# Patient Record
Sex: Female | Born: 1995 | Hispanic: Refuse to answer | Smoking: Never smoker
Health system: Southern US, Community
[De-identification: ages and names within clinical notes are randomized; demographics above are authoritative.]

## PROBLEM LIST (undated history)

## (undated) DIAGNOSIS — T7840XA Allergy, unspecified, initial encounter: Secondary | ICD-10-CM

## (undated) HISTORY — DX: Allergy, unspecified, initial encounter: T78.40XA

## (undated) HISTORY — PX: TONSILLECTOMY: SUR1361

## (undated) HISTORY — PX: TONSILLECTOMY AND ADENOIDECTOMY: SUR1326

## (undated) HISTORY — PX: WISDOM TOOTH EXTRACTION: SHX21

---
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
## 2017-08-18 ENCOUNTER — Ambulatory Visit (INDEPENDENT_AMBULATORY_CARE_PROVIDER_SITE_OTHER): Payer: BLUE CROSS/BLUE SHIELD | Admitting: Family Medicine

## 2017-08-18 ENCOUNTER — Encounter: Payer: Self-pay | Admitting: Family Medicine

## 2017-08-18 VITALS — BP 112/68 | HR 63 | Temp 97.8°F | Resp 14

## 2017-08-18 DIAGNOSIS — J069 Acute upper respiratory infection, unspecified: Secondary | ICD-10-CM

## 2017-08-18 MED ORDER — AZITHROMYCIN 250 MG PO TABS: ORAL_TABLET | ORAL | 0 refills | Status: DC

## 2017-08-18 NOTE — Progress Notes (Signed)
Patient presents today with symptoms of nasal congestion, mild productive cough off and on for the last 2-3 weeks. Patient denies any fever or chills. She denies any chest pain or shortness of breath. She has heard herself wheezing at times. She has been told in the past that she has restrictive airway disease/asthma. She does have an inhaler but rarely needs to use it. She denies any seasonal allergies. She has not been taking any cough medicine on a regular basis while she has had symptoms.  ROS: Negative except mentioned above. Vitals as per Epic. GENERAL: NAD HEENT: mild pharyngeal erythema, no exudate, no erythema of TMs, no cervical LAD RESP: CTA B, no accessory muscle use, no wheezing appreciated CARD: RRR NEURO: CN II-XII grossly intact   A/P: URI - will treat with Claritin, Delsym, Z-Pak, rest, hydration, seek medical attention if symptoms persist or worsen as discussed should not do any athletic activity if febrile. Should use albuterol inhaler when necessary as discussed.

## 2017-08-26 ENCOUNTER — Ambulatory Visit (INDEPENDENT_AMBULATORY_CARE_PROVIDER_SITE_OTHER): Payer: BLUE CROSS/BLUE SHIELD | Admitting: Family Medicine

## 2017-08-26 ENCOUNTER — Ambulatory Visit
Admission: RE | Admit: 2017-08-26 | Discharge: 2017-08-26 | Disposition: A | Discharge status: Home / Self Care | Payer: BLUE CROSS/BLUE SHIELD | Source: Ambulatory Visit | Attending: Family Medicine | Admitting: Family Medicine

## 2017-08-26 ENCOUNTER — Other Ambulatory Visit: Payer: Self-pay | Admitting: Family Medicine

## 2017-08-26 ENCOUNTER — Encounter: Payer: Self-pay | Admitting: Family Medicine

## 2017-08-26 VITALS — BP 109/70 | HR 69 | Temp 97.8°F | Resp 14

## 2017-08-26 DIAGNOSIS — J209 Acute bronchitis, unspecified: Secondary | ICD-10-CM | POA: Diagnosis not present

## 2017-08-26 DIAGNOSIS — R05 Cough: Secondary | ICD-10-CM | POA: Diagnosis present

## 2017-08-26 DIAGNOSIS — R059 Cough, unspecified: Secondary | ICD-10-CM

## 2017-08-26 NOTE — Progress Notes (Signed)
Patient presents today for follow-up regarding her upper respiratory infection. Patient states that she finished her Z-Pak. She has been starting to use her inhaler more frequently every 4 hours or so. She states that she can't get a deep breath in her chest. She has been prescribed a maintenance medication but does not remember the name of it. She has not been using that medication. She denies any fevers or chills or any chest pain. She has been taking over-the-counter cough medicine.  ROS: Negative except mentioned above. Vitals as per Epic. GENERAL: NAD HEENT: no pharyngeal erythema, no exudate, no erythema of TMs, no cervical LAD RESP: Slightly decreased breath sounds at the bases, no wheezing appreciated no accessory muscle use noted, can talk in complete sentences without stopping CARD: RRR NEURO: CN II-XII grossly intact   A/P: Bronchitis - will get CXR, continue over-the-counter cough medicine and oral antihistamine, continue use albuterol inhaler, I have asked that the patient inform me when she gets home about the questionable steroid inhaler she has at home. Will start using this type of inhaler as a maintenance for now. Will seek medical attention if symptoms persist or worsen. No physical activity until improvement in respiratory symptoms.

## 2017-11-13 ENCOUNTER — Ambulatory Visit (INDEPENDENT_AMBULATORY_CARE_PROVIDER_SITE_OTHER): Payer: BLUE CROSS/BLUE SHIELD | Admitting: Family Medicine

## 2017-11-13 VITALS — BP 117/73 | HR 82 | Temp 99.3°F | Resp 14

## 2017-11-13 DIAGNOSIS — J069 Acute upper respiratory infection, unspecified: Secondary | ICD-10-CM

## 2017-11-13 MED ORDER — CEFDINIR 300 MG PO CAPS: 300.0000 mg | ORAL_CAPSULE | Freq: Two times a day (BID) | ORAL | 0 refills | Status: DC

## 2017-11-13 NOTE — Progress Notes (Signed)
Patient presents today with symptoms of nasal congestion, cough. Patient has had symptoms for the last week. She states that her mucous is yellow in color. She does have a history of asthma and does take her albuterol inhaler periodically. She states that over Thanksgiving break she got Omnicef for a upper respiratory infection and it cleared it up. She requests this medication again. She denies any new allergens. She denies any fever, chest pain, shortness of breath, severe headache.  ROS: Negative except mentioned above. Vitals as per Epic. GENERAL: NAD HEENT: no pharyngeal erythema, no exudate, no erythema of TMs, no cervical LAD RESP: CTA B CARD: RRR NEURO: CN II-XII grossly intact   A/P: Upper respiratory infection - will treat with Omnicef, Delsym when necessary, Claritin when necessary, look for any possible allergens that could be continued beatings his symptoms, rest, hydration, no athletic activity if febrile, seek medical attention if symptoms persist or worsen as discussed.

## 2018-02-03 ENCOUNTER — Encounter: Payer: Self-pay | Admitting: Family Medicine

## 2018-02-03 ENCOUNTER — Ambulatory Visit (INDEPENDENT_AMBULATORY_CARE_PROVIDER_SITE_OTHER): Payer: PRIVATE HEALTH INSURANCE | Admitting: Family Medicine

## 2018-02-03 DIAGNOSIS — S86899A Other injury of other muscle(s) and tendon(s) at lower leg level, unspecified leg, initial encounter: Secondary | ICD-10-CM | POA: Diagnosis not present

## 2018-02-03 MED ORDER — DICLOFENAC SODIUM 75 MG PO TBEC: 75.0000 mg | DELAYED_RELEASE_TABLET | Freq: Two times a day (BID) | ORAL | 0 refills | Status: DC

## 2018-02-03 NOTE — Progress Notes (Addendum)
Patient presents today with history of bilateral shin splints. She is a jumping events in track and field. Patient states that she has had problems with shin splints for a few years. She states recently they have caused increased pain. She denies any focal area of discomfort. There is no consistency of which side hurts more. She does feel knots along the medial border of the tibia at times. She denies any tingling or numbness of the foot or discoloration of the foot. She denies the lower extremity feeling "rock hard" with doing activity. Patient does have flat feet and was told in the past to get inserts but has not done so. She has not taken any medications for her symptoms. She has not seen PT yet but has been working with her trainer. She did take 1 week of rest last week which did not help much. She is unsure of her vitamin D level. She denies any history of stress injury/stress fracture in the past. She denies any pain at rest or limp with walking.  ROS: Negative except mentioned above.  GENERAL: NAD MSK: Bilateral lower extremities - pes planus, overpronation, mild to moderate tenderness along medial border of the tibia bilaterally, there is no focal area of tenderness bilaterally, full range of motion of lower extremities, mildly tight calf muscles, negative hop test, NV intact NEURO: CN II-XII grossly intact   A/P: Medial Tibial Stress Syndrome Bilaterally - encourage patient to ice, stretch, wear inserts in all shoes, needs arch support, modify activity during practice, would recommend that patient CPT for evaluation/treatment, can try dry needling if patient wants, Voltaren as needed, no suggestive symptoms of compartment syndrome at this time but did discuss these with patient, will discuss above with trainer, seek medical attention if symptoms persist or worsen as discussed.

## 2018-02-04 LAB — VITAMIN D 25 HYDROXY (VIT D DEFICIENCY, FRACTURES): VIT D 25 HYDROXY: 27.6 ng/mL — AB (ref 30.0–100.0)

## 2018-06-19 ENCOUNTER — Ambulatory Visit (INDEPENDENT_AMBULATORY_CARE_PROVIDER_SITE_OTHER): Payer: BLUE CROSS/BLUE SHIELD | Admitting: Family Medicine

## 2018-06-19 DIAGNOSIS — F419 Anxiety disorder, unspecified: Secondary | ICD-10-CM
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
## 2018-07-23 ENCOUNTER — Ambulatory Visit (INDEPENDENT_AMBULATORY_CARE_PROVIDER_SITE_OTHER): Payer: PRIVATE HEALTH INSURANCE | Admitting: Family Medicine

## 2018-07-23 ENCOUNTER — Encounter: Payer: Self-pay | Admitting: Family Medicine

## 2018-07-23 VITALS — HR 68 | Temp 98.8°F | Resp 14

## 2018-07-23 DIAGNOSIS — J45990 Exercise induced bronchospasm: Secondary | ICD-10-CM | POA: Diagnosis not present

## 2018-07-23 MED ORDER — ALBUTEROL SULFATE HFA 108 (90 BASE) MCG/ACT IN AERS
2.0000 | INHALATION_SPRAY | Freq: Four times a day (QID) | RESPIRATORY_TRACT | 2 refills | Status: DC | PRN
Start: 2018-07-23 — End: 2020-11-07

## 2018-07-23 NOTE — Progress Notes (Signed)
Patient presents today for refill of her Proventil.  Patient has exercise-induced bronchospasm.  She denies any significant problems today.  As the air does become more dry she does notice that she needs an inhaler more with exercise.  She denies waking up in the middle of the night to use her inhaler.  She denies any hospitalizations related to her asthma in the past.  She denies any chest pain, fever, shortness of breath at this time.  ROS: Negative except mentioned above. Vitals as per Epic GENERAL: NAD HEENT: no pharyngeal erythema, no exudate, no erythema of TMs, no cervical LAD RESP: CTA B CARD: RRR NEURO: CN II-XII grossly intact    A/P: Exercise-induced bronchospasm -will refill patient's Proventil, denies patient on when to use medication, encourage patient to start oral antihistamine if any allergies during the fall season, should get baseline peak flows by athletic trainer, recommend patient always carry Proventil with her, seek medical attention needed.

## 2018-08-13 ENCOUNTER — Encounter: Payer: Self-pay | Admitting: Family Medicine

## 2018-08-13 ENCOUNTER — Ambulatory Visit
Admission: RE | Admit: 2018-08-13 | Discharge: 2018-08-13 | Disposition: A | Discharge status: Home / Self Care | Payer: BLUE CROSS/BLUE SHIELD | Source: Ambulatory Visit | Attending: Family Medicine | Admitting: Family Medicine

## 2018-08-13 ENCOUNTER — Ambulatory Visit (INDEPENDENT_AMBULATORY_CARE_PROVIDER_SITE_OTHER): Payer: Self-pay | Admitting: Family Medicine

## 2018-08-13 VITALS — Resp 14

## 2018-08-13 DIAGNOSIS — M79671 Pain in right foot: Secondary | ICD-10-CM

## 2018-08-14 ENCOUNTER — Other Ambulatory Visit: Payer: Self-pay | Admitting: Family Medicine

## 2018-08-14 DIAGNOSIS — M79671 Pain in right foot: Secondary | ICD-10-CM

## 2018-08-14 NOTE — Progress Notes (Signed)
Patient presents today with history of right foot pain for the last 3 weeks.  Patient is a Electronics engineer and track and field.  She denies any injury that she can recall.  She denies any significant swelling or bruising of the area.  She does admit to pes planus and training barefoot at times on the grass.  She denies any stress fractures in the past.  She has had shinsplints.  Patient does not like to drink milk however does eat other dairy products.  Her vitamin D3 was 27.6 (01/2018).  She currently is not taking any vitamin D supplement.  She denies any changes in shoewear.  She denies any problems with menstrual cycles.  ROS: Negative except mentioned above. Vitals as per Epic. GENERAL: NAD RESP: CTA B CARD: RRR MSK: Right foot -pes planus, there is no significant swelling or ecchymosis appreciated, there is a focal area of tenderness in the midfoot at the proximal second and third metatarsal area, full range of motion, positive hop test, N/V intact NEURO: CN II-XII grossly intact   A/P: Right foot pain -concerns for stress injury, will do x-ray and proceed with getting MRI, should wear walking boot for now, can do crosstraining and upper body activity if tolerated with no pain, encourage patient to take vitamin D3 4000 IUs daily.  Will discuss plan with trainer.  Seek medical attention if symptoms persist or worsen as discussed.

## 2018-08-19 ENCOUNTER — Ambulatory Visit
Admission: RE | Admit: 2018-08-19 | Discharge: 2018-08-19 | Disposition: A | Discharge status: Home / Self Care | Payer: BLUE CROSS/BLUE SHIELD | Source: Ambulatory Visit | Attending: Family Medicine | Admitting: Family Medicine

## 2018-08-19 DIAGNOSIS — M84374A Stress fracture, right foot, initial encounter for fracture: Secondary | ICD-10-CM | POA: Insufficient documentation

## 2018-08-19 DIAGNOSIS — M79671 Pain in right foot: Secondary | ICD-10-CM

## 2018-08-19 DIAGNOSIS — X58XXXA Exposure to other specified factors, initial encounter: Secondary | ICD-10-CM | POA: Diagnosis not present

## 2018-11-30 ENCOUNTER — Ambulatory Visit (INDEPENDENT_AMBULATORY_CARE_PROVIDER_SITE_OTHER): Payer: PRIVATE HEALTH INSURANCE | Admitting: Family Medicine

## 2018-11-30 VITALS — Temp 98.2°F | Resp 14

## 2018-11-30 DIAGNOSIS — M898X6 Other specified disorders of bone, lower leg: Secondary | ICD-10-CM | POA: Diagnosis not present

## 2018-12-01 ENCOUNTER — Ambulatory Visit
Admission: RE | Admit: 2018-12-01 | Discharge: 2018-12-01 | Disposition: A | Discharge status: Home / Self Care | Payer: BLUE CROSS/BLUE SHIELD | Attending: Family Medicine | Admitting: Family Medicine

## 2018-12-01 ENCOUNTER — Ambulatory Visit
Admission: RE | Admit: 2018-12-01 | Discharge: 2018-12-01 | Disposition: A | Discharge status: Home / Self Care | Payer: BLUE CROSS/BLUE SHIELD | Source: Ambulatory Visit | Attending: Family Medicine | Admitting: Family Medicine

## 2018-12-01 DIAGNOSIS — M79669 Pain in unspecified lower leg: Secondary | ICD-10-CM | POA: Diagnosis present

## 2018-12-01 DIAGNOSIS — M898X6 Other specified disorders of bone, lower leg: Secondary | ICD-10-CM | POA: Diagnosis present

## 2018-12-02 ENCOUNTER — Other Ambulatory Visit: Payer: Self-pay | Admitting: Family Medicine

## 2018-12-02 DIAGNOSIS — M79662 Pain in left lower leg: Secondary | ICD-10-CM

## 2018-12-03 ENCOUNTER — Ambulatory Visit: Admission: RE | Admit: 2018-12-03 | Payer: PRIVATE HEALTH INSURANCE | Source: Ambulatory Visit

## 2018-12-09 ENCOUNTER — Ambulatory Visit
Admission: RE | Admit: 2018-12-09 | Discharge: 2018-12-09 | Disposition: A | Discharge status: Home / Self Care | Payer: BLUE CROSS/BLUE SHIELD | Source: Ambulatory Visit | Attending: Family Medicine | Admitting: Family Medicine

## 2018-12-09 DIAGNOSIS — M79662 Pain in left lower leg: Secondary | ICD-10-CM | POA: Diagnosis present

## 2019-04-06 NOTE — Progress Notes (Signed)
Patient presents with symptoms of right mid shin pain for the past 4 weeks. Feels different now then her normal shin splints. 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 She denies any trauma or injury to the site. She denies any ecchymosis but has some "knots" along the shin. Recently has noticed some pain with walking. She was put in a walking boot by her trainer. She denies any pain in the boot today. She denies any pain while sleeping. She denies any menstrual irregularities. Admits to normal amount of training, no extra running. Patient has been told in the past that she has low Vitamin D (27.6, 02/03/2018) but admits she has not been consistent with taking her Vitamin D3 supplement.   ROS: Negative except mentioned above. Vitals as per Epic.  GENERAL: NAD RESP: CTA B CARD: RRR MSK: RLE - no obvious deformity, mild tenderness along mid tibia  (2.5 cm area), FROM, +hop test, nv intact   NEURO: CN II-XII grossly intact   A/P: RLE Pain - suggestive of stress injury, will get Xrays and MRI, stay in walking boot for now, no lower extremity activity for now until imaging reviewed, encouraged being consistent with 4000 IU Vitamin D3 supplement, repeat level in 3 months, discussed plan with athletic trainer.

## 2019-10-08 IMAGING — MR MR [PERSON_NAME] LOW W/O CM*L*
4 of 5 series · 11 of 40 positions shown · non-contrast
Comparison: None.

CLINICAL DATA: Anterior left shin pain x4 weeks.

EXAM:
MRI OF LOWER LEFT EXTREMITY WITHOUT CONTRAST
TECHNIQUE: Multiplanar, multisequence MR imaging of the left leg was performed.
No intravenous contrast was administered.

[Series 4: t1_tse_cor_480_bilat_ · coronal · left · 4.0mm · 0.41mm/px · 3 of 26 slices shown]
[im 1/26]
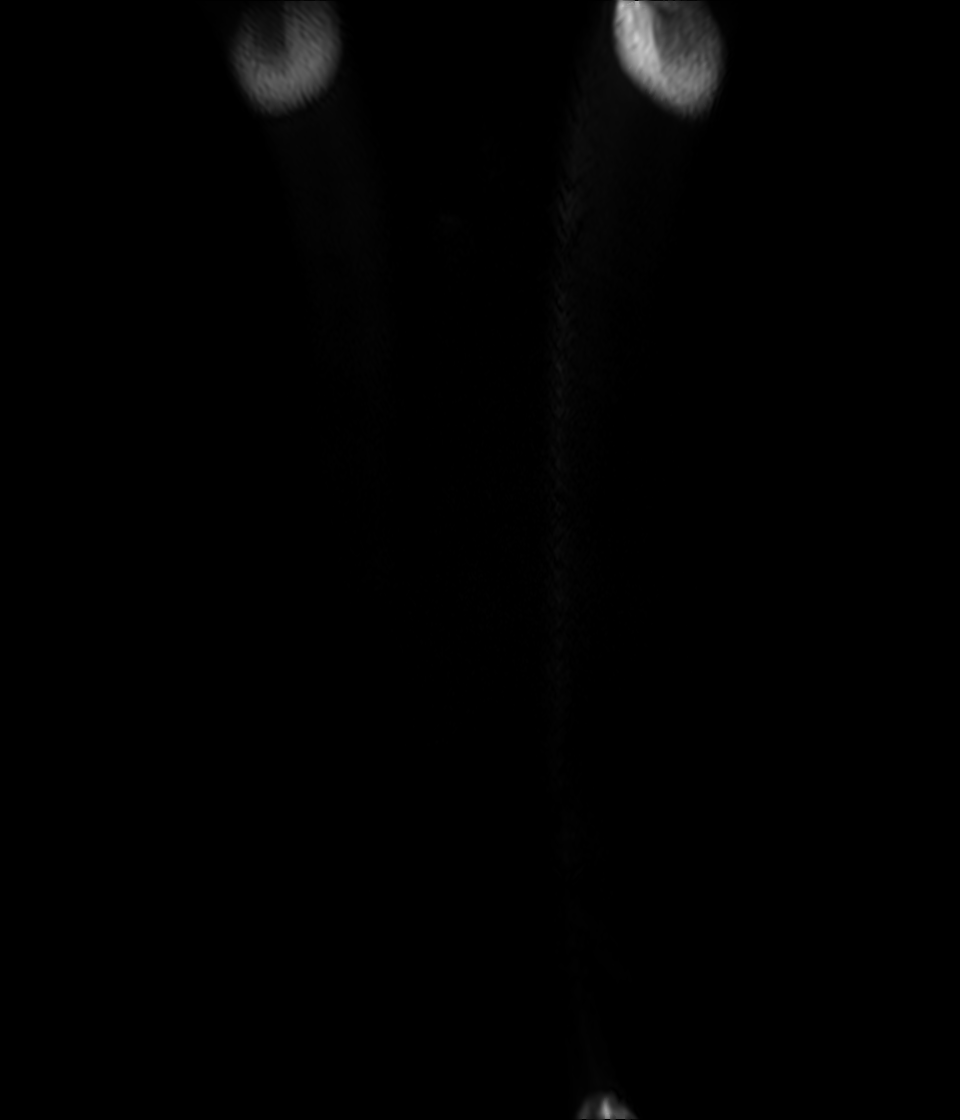
[im 17/26]
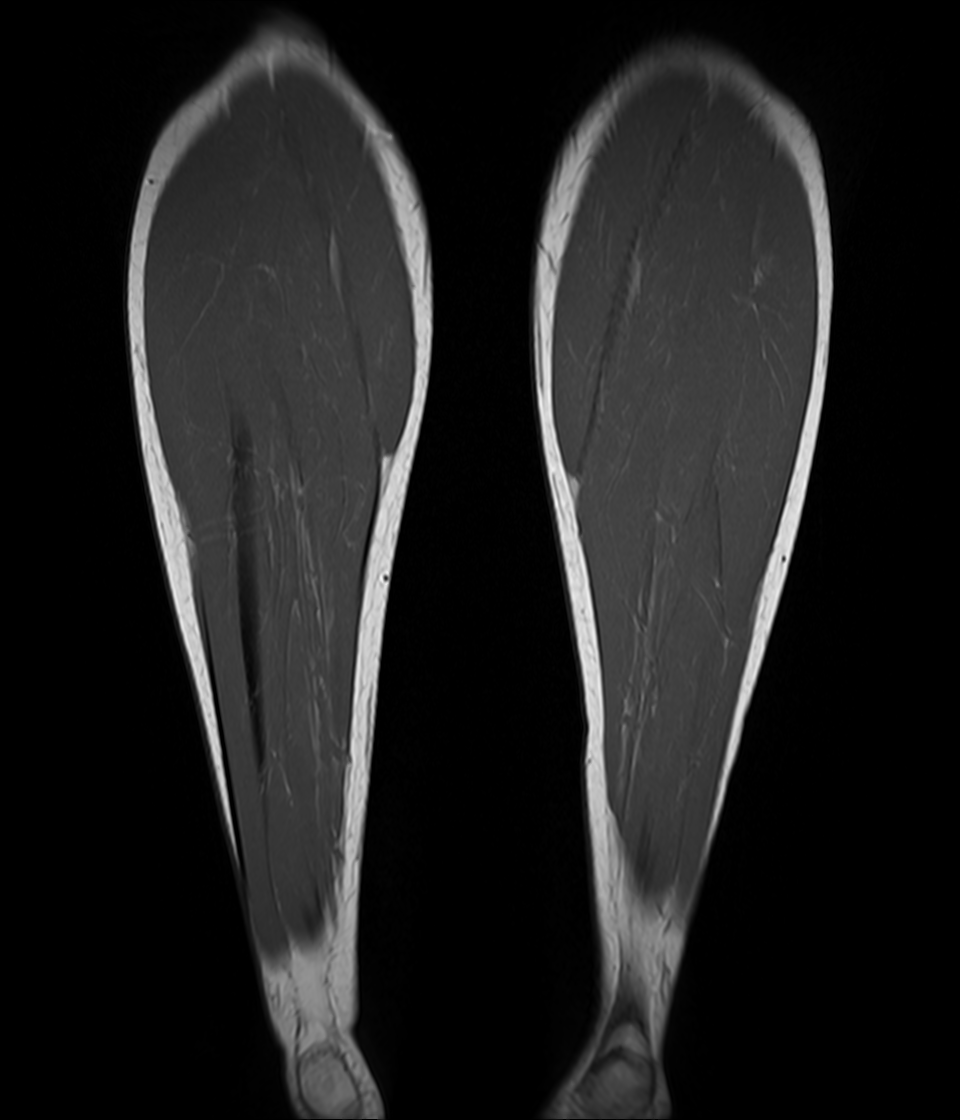
[im 26/26]
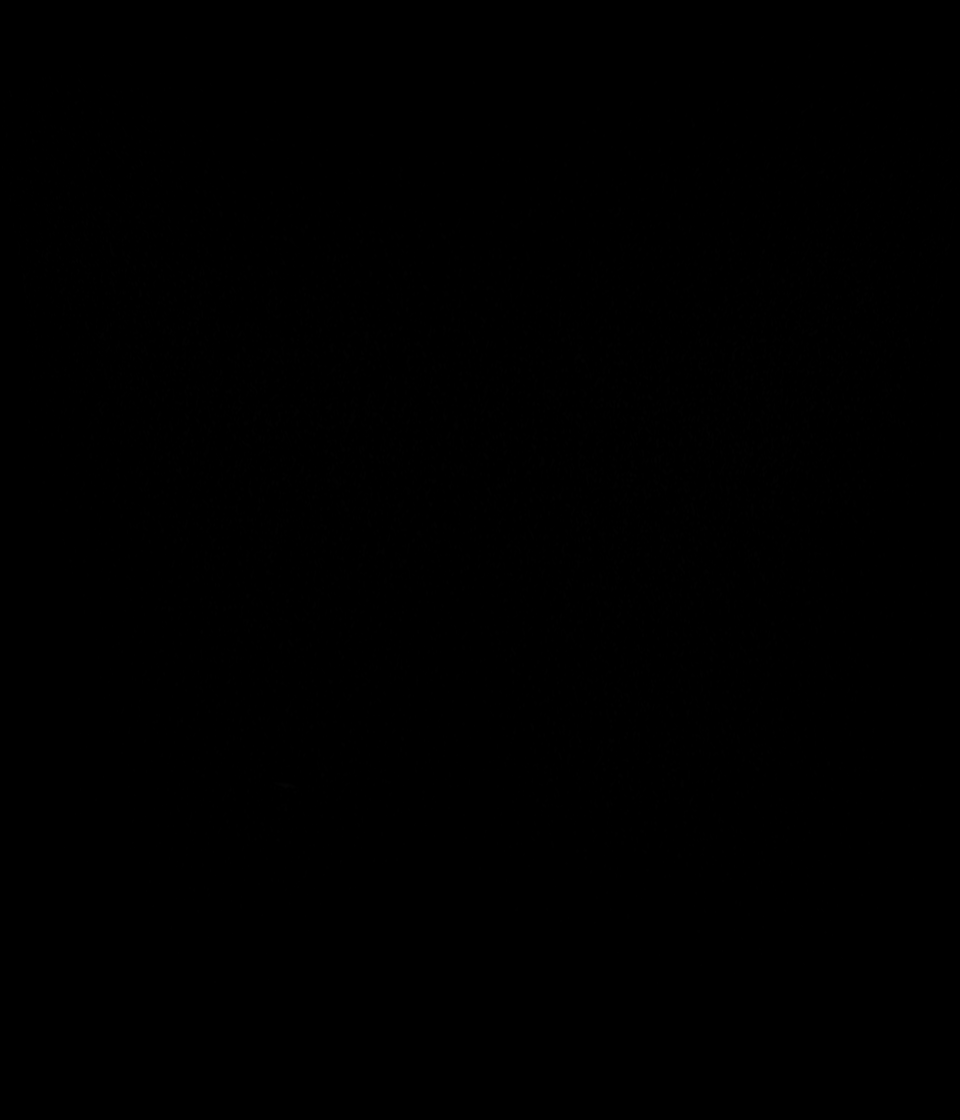

[Series 8: ax t1_comp_filt · axial · left · 4.0mm · 0.70mm/px · z∈[-165,+135]mm · 3 of 88 slices shown]
[im 14/88]
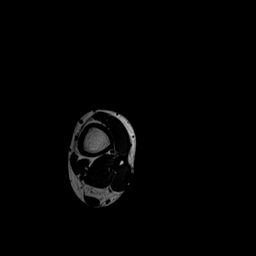
[im 47/88]
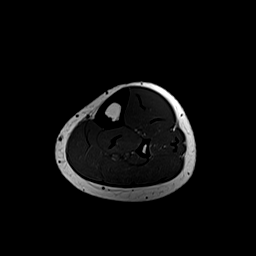
[im 74/88]
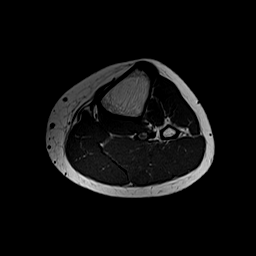

[Series 11: T2 · axial · left · 4.0mm · 0.66mm/px · z∈[-165,+135]mm · 3 of 88 slices shown]
[im 14/88]
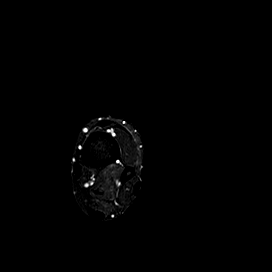
[im 47/88]
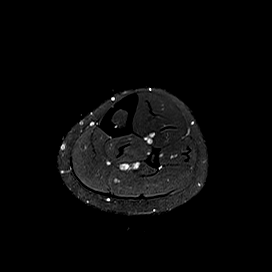
[im 74/88]
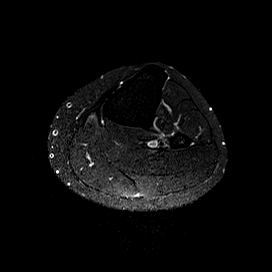

[Series 12: t2_tse_stir_sag · sagittal · left · 4.5mm · 0.42mm/px · 2 of 25 slices shown]
[im 1/25]
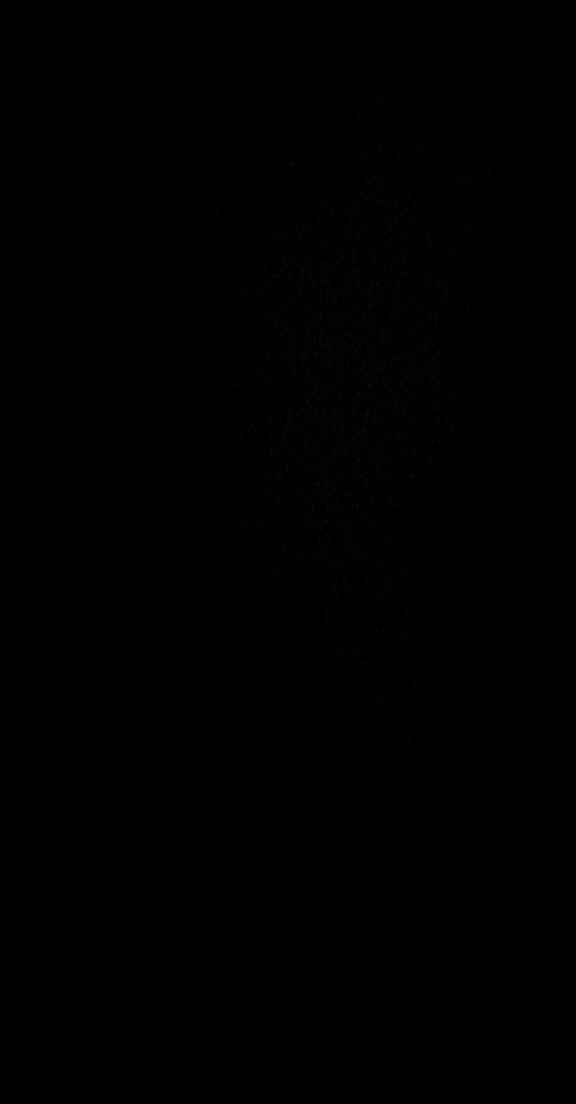
[im 17/25]
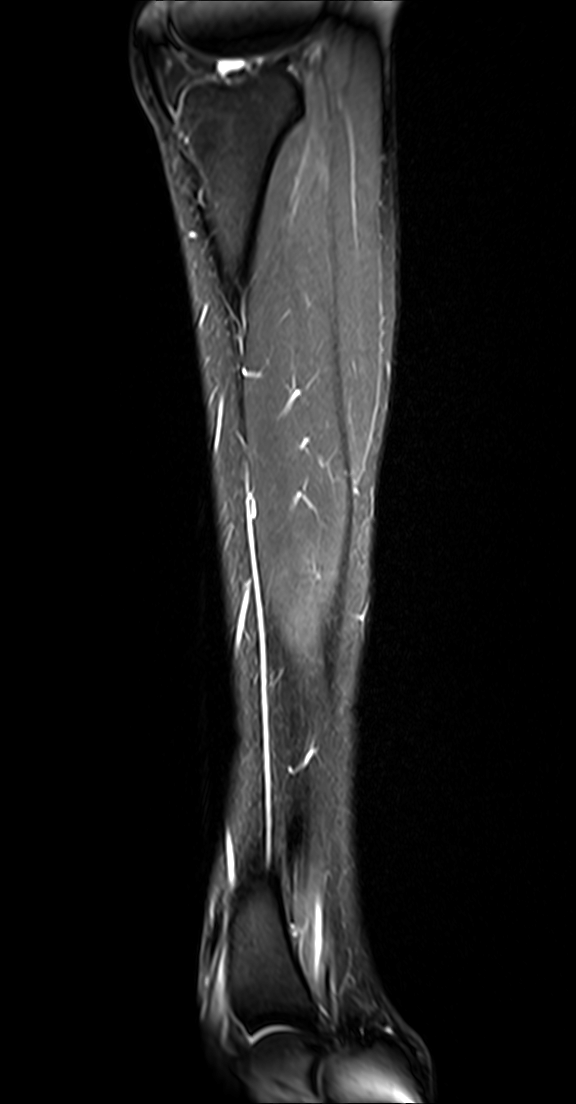

[11 of 40 positions shown; findings below may reference images not displayed]

FINDINGS: Bones/Joint/Cartilage

Cortical thickening of the anterior left tibial cortex at midshaft
is identified without fracture line. The fibula is intact. Healing
or healed stress fractures may manifest as such eccentric cortical
thickening. Minimal marrow edema deep to the area of focal
thickening is noted likely representing some residual stigmata of a
stress fracture, image 38, series 11 for example.

Ligaments

Noncontributory

Muscles and Tendons

No intramuscular edema, fluid or atrophy.

Soft tissues

Negative
IMPRESSION: 1. Eccentric cortical thickening of the mid tibial diaphysis
anteriorly which may reflect a healing or healed stress fracture
with some residual underlying marrow edema noted at midshaft.
2. No acute fracture line is noted.

## 2020-06-27 ENCOUNTER — Encounter: Payer: Self-pay | Admitting: Medical

## 2020-06-27 ENCOUNTER — Ambulatory Visit: Payer: BC Managed Care – PPO | Admitting: Medical

## 2020-06-27 ENCOUNTER — Other Ambulatory Visit: Payer: Self-pay

## 2020-06-27 VITALS — BP 136/80 | HR 90 | Temp 97.7°F | Resp 16 | Wt 135.8 lb

## 2020-06-27 DIAGNOSIS — H6983 Other specified disorders of Eustachian tube, bilateral: Secondary | ICD-10-CM

## 2020-06-27 DIAGNOSIS — R591 Generalized enlarged lymph nodes: Secondary | ICD-10-CM

## 2020-06-27 MED ORDER — PREDNISONE 10 MG (21) PO TBPK: ORAL_TABLET | ORAL | 0 refills | Status: DC

## 2020-06-27 NOTE — Progress Notes (Signed)
Subjective:    Patient ID: Margaret Hooper, female    DOB: 01-04-1996, 24 y.o.   MRN: [id number]  HPI 24 yo female in non acute distress. Presents today with swelling to neck on left  side. Denies any infections or rashes on body.  Took Benadryl on Sunday night for nasal congestion. She just returned to  [facility] ( she was living there and now has returned to [place].  Blood pressure 136/80, pulse 90, temperature 97.7 F (36.5 C), temperature source Temporal, resp. rate 16, weight 135 lb 12.8 oz (61.6 kg), SpO2 100 %.  Allergies  Allergen Reactions  . Penicillins Shortness Of Breath  . Sulfur Rash    Review of Systems  Constitutional: Positive for fatigue. Negative for chills and fever.  HENT: Positive for congestion (Sunday night now resolved.), rhinorrhea (Sunday night) and sneezing. Negative for ear discharge, postnasal drip, sinus pressure, sinus pain and sore throat.   Respiratory: Positive for chest tightness (mild noted when trying to take a deep breath has Asthma history, does not use inhaler daily). Negative for cough and shortness of breath.   Cardiovascular: Negative for chest pain.  Gastrointestinal: Negative for abdominal pain, diarrhea, nausea and vomiting.  Musculoskeletal: Negative for back pain.  Allergic/Immunologic: Positive for environmental allergies.  Hematological: Positive for adenopathy.      hx adnoids/tonils surgery Objective:   Physical Exam Vitals and nursing note reviewed.  HENT:     Right Ear: Ear canal and external ear normal. A middle ear effusion is present.     Left Ear: Ear canal and external ear normal. A middle ear effusion (worse>then right) is present.     Nose: Congestion (right) and rhinorrhea (right) present.     Mouth/Throat:     Mouth: Mucous membranes are moist.     Pharynx: Oropharynx is clear.  Eyes:     Extraocular Movements: Extraocular movements intact.     Conjunctiva/sclera: Conjunctivae normal.     Pupils: Pupils are  equal, round, and reactive to light.  Neck:   Cardiovascular:     Rate and Rhythm: Normal rate and regular rhythm.  Pulmonary:     Effort: Pulmonary effort is normal.     Breath sounds: Normal breath sounds.  Musculoskeletal:        General: Normal range of motion.  Lymphadenopathy:     Head:     Right side of head: No submental, submandibular, tonsillar, preauricular, posterior auricular or occipital adenopathy.     Left side of head: No submental, submandibular, tonsillar, preauricular, posterior auricular or occipital adenopathy.     Cervical: Cervical adenopathy present.     Right cervical: No posterior cervical adenopathy.    Left cervical: Superficial cervical adenopathy present. No deep or posterior cervical adenopathy.     Upper Body:     Right upper body: No supraclavicular or axillary adenopathy.     Left upper body: No supraclavicular or axillary adenopathy.  Skin:    General: Skin is warm and dry.  Neurological:     General: No focal deficit present.     Mental Status: She is oriented to person, place, and time.     GCS: GCS eye subscore is 4. GCS verbal subscore is 5. GCS motor subscore is 6.     Cranial Nerves: Cranial nerves are intact.     Motor: Motor function is intact.  Psychiatric:        Attention and Perception: Attention and perception normal.  Mood and Affect: Mood normal.        Speech: Speech normal.        Behavior: Behavior normal.        Thought Content: Thought content normal.        Cognition and Memory: Cognition normal.        Judgment: Judgment normal.      Noted on left side of neck,lower left side of neck,dime size circular, non tender, no change in skin color. No supraclavicular lymphnode- bilaterally    Assessment & Plan:  Lymph adenopathy, left side of neck Eustachian tube dysfunction, bilaterally Monitor size, if not resolving or if worsening to seek out medical care. Start taking a  OTC non sedative antihistimine like  Zyrtec, Claritin or Allegra. Start taking  OTC Flonase  NS per package instructions. Meds ordered this encounter  Medications  . predniSONE (STERAPRED UNI-PAK 21 TAB) 10 MG (21) TBPK tablet    Sig: Take 6 tablets by mouth day one, then 5 tablets tomorrow and one less tablet everyday  Thereafter. Take with food.    Dispense:  21 tablet    Refill:  0   Going out of town untill October 23rd.  If worsening to seek care at an Urgent Care or closest Emergency Department .Return to this clinic as needed. If ear pain call and I will prescribe an antibiotic. Patient verbalizes understanding and has no questions at discharge.

## 2020-06-27 NOTE — Patient Instructions (Signed)
Eustachian Tube Dysfunction ° °Eustachian tube dysfunction refers to a condition in which a blockage develops in the narrow passage that connects the middle ear to the back of the nose (eustachian tube). The eustachian tube regulates air pressure in the middle ear by letting air move between the ear and nose. It also helps to drain fluid from the middle ear space. °Eustachian tube dysfunction can affect one or both ears. When the eustachian tube does not function properly, air pressure, fluid, or both can build up in the middle ear. °What are the causes? °This condition occurs when the eustachian tube becomes blocked or cannot open normally. Common causes of this condition include: °· Ear infections. °· Colds and other infections that affect the nose, mouth, and throat (upper respiratory tract). °· Allergies. °· Irritation from cigarette smoke. °· Irritation from stomach acid coming up into the esophagus (gastroesophageal reflux). The esophagus is the tube that carries food from the mouth to the stomach. °· Sudden changes in air pressure, such as from descending in an airplane or scuba diving. °· Abnormal growths in the nose or throat, such as: °? Growths that line the nose (nasal polyps). °? Abnormal growth of cells (tumors). °? Enlarged tissue at the back of the throat (adenoids). °What increases the risk? °You are more likely to develop this condition if: °· You smoke. °· You are overweight. °· You are a child who has: °? Certain birth defects of the mouth, such as cleft palate. °? Large tonsils or adenoids. °What are the signs or symptoms? °Common symptoms of this condition include: °· A feeling of fullness in the ear. °· Ear pain. °· Clicking or popping noises in the ear. °· Ringing in the ear. °· Hearing loss. °· Loss of balance. °· Dizziness. °Symptoms may get worse when the air pressure around you changes, such as when you travel to an area of high elevation, fly on an airplane, or go scuba diving. °How is  this diagnosed? °This condition may be diagnosed based on: °· Your symptoms. °· A physical exam of your ears, nose, and throat. °· Tests, such as those that measure: °? The movement of your eardrum (tympanogram). °? Your hearing (audiometry). °How is this treated? °Treatment depends on the cause and severity of your condition. °· In mild cases, you may relieve your symptoms by moving air into your ears. This is called "popping the ears." °· In more severe cases, or if you have symptoms of fluid in your ears, treatment may include: °? Medicines to relieve congestion (decongestants). °? Medicines that treat allergies (antihistamines). °? Nasal sprays or ear drops that contain medicines that reduce swelling (steroids). °? A procedure to drain the fluid in your eardrum (myringotomy). In this procedure, a small tube is placed in the eardrum to: °§ Drain the fluid. °§ Restore the air in the middle ear space. °? A procedure to insert a balloon device through the nose to inflate the opening of the eustachian tube (balloon dilation). °Follow these instructions at home: °Lifestyle °· Do not do any of the following until your health care provider approves: °? Travel to high altitudes. °? Fly in airplanes. °? Work in a pressurized cabin or room. °? Scuba dive. °· Do not use any products that contain nicotine or tobacco, such as cigarettes and e-cigarettes. If you need help quitting, ask your health care provider. °· Keep your ears dry. Wear fitted earplugs during showering and bathing. Dry your ears completely after. °General instructions °· Take over-the-counter   and prescription medicines only as told by your health care provider. °· Use techniques to help pop your ears as recommended by your health care provider. These may include: °? Chewing gum. °? Yawning. °? Frequent, forceful swallowing. °? Closing your mouth, holding your nose closed, and gently blowing as if you are trying to blow air out of your nose. °· Keep all  follow-up visits as told by your health care provider. This is important. °Contact a health care provider if: °· Your symptoms do not go away after treatment. °· Your symptoms come back after treatment. °· You are unable to pop your ears. °· You have: °? A fever. °? Pain in your ear. °? Pain in your head or neck. °? Fluid draining from your ear. °· Your hearing suddenly changes. °· You become very dizzy. °· You lose your balance. °Summary °· Eustachian tube dysfunction refers to a condition in which a blockage develops in the eustachian tube. °· It can be caused by ear infections, allergies, inhaled irritants, or abnormal growths in the nose or throat. °· Symptoms include ear pain, hearing loss, or ringing in the ears. °· Mild cases are treated with maneuvers to unblock the ears, such as yawning or ear popping. °· Severe cases are treated with medicines. Surgery may also be done (rare). °This information is not intended to replace advice given to you by your health care provider. Make sure you discuss any questions you have with your health care provider. °Document Revised: 01/27/2018 Document Reviewed: 01/27/2018 °Elsevier Patient Education © 2020 [employer] ° °

## 2020-06-28 ENCOUNTER — Encounter: Payer: Self-pay | Admitting: Medical

## 2020-07-06 ENCOUNTER — Encounter: Payer: Self-pay | Admitting: Medical

## 2020-11-07 ENCOUNTER — Ambulatory Visit: Payer: BC Managed Care – PPO | Admitting: Medical

## 2020-11-07 ENCOUNTER — Encounter: Payer: Self-pay | Admitting: Medical

## 2020-11-07 ENCOUNTER — Other Ambulatory Visit: Payer: Self-pay

## 2020-11-07 VITALS — BP 134/84 | HR 103 | Temp 98.1°F | Resp 18 | Wt 140.0 lb

## 2020-11-07 DIAGNOSIS — H6502 Acute serous otitis media, left ear: Secondary | ICD-10-CM

## 2020-11-07 DIAGNOSIS — H6983 Other specified disorders of Eustachian tube, bilateral: Secondary | ICD-10-CM

## 2020-11-07 DIAGNOSIS — A4902 Methicillin resistant Staphylococcus aureus infection, unspecified site: Secondary | ICD-10-CM | POA: Insufficient documentation

## 2020-11-07 DIAGNOSIS — R221 Localized swelling, mass and lump, neck: Secondary | ICD-10-CM

## 2020-11-07 DIAGNOSIS — R300 Dysuria: Secondary | ICD-10-CM

## 2020-11-07 LAB — POCT URINALYSIS DIPSTICK
Bilirubin, UA: NEGATIVE
Blood, UA: NEGATIVE
Glucose, UA: NEGATIVE
Ketones, UA: NEGATIVE
Leukocytes, UA: NEGATIVE
Nitrite, UA: NEGATIVE
Protein, UA: NEGATIVE
Spec Grav, UA: <=1.005 — AB (ref 1.010–1.025)
Urobilinogen, UA: 0.2 E.U./dL
pH, UA: 6.5 (ref 5.0–8.0)

## 2020-11-07 MED ORDER — PREDNISONE 10 MG (21) PO TBPK: ORAL_TABLET | ORAL | 0 refills | Status: DC

## 2020-11-07 MED ORDER — AZITHROMYCIN 250 MG PO TABS: ORAL_TABLET | ORAL | 0 refills | Status: DC

## 2020-11-07 NOTE — Patient Instructions (Signed)
Eustachian Tube Dysfunction  Eustachian tube dysfunction refers to a condition in which a blockage develops in the narrow passage that connects the middle ear to the back of the nose (eustachian tube). The eustachian tube regulates air pressure in the middle ear by letting air move between the ear and nose. It also helps to drain fluid from the middle ear space. Eustachian tube dysfunction can affect one or both ears. When the eustachian tube does not function properly, air pressure, fluid, or both can build up in the middle ear. What are the causes? This condition occurs when the eustachian tube becomes blocked or cannot open normally. Common causes of this condition include:  Ear infections.  Colds and other infections that affect the nose, mouth, and throat (upper respiratory tract).  Allergies.  Irritation from cigarette smoke.  Irritation from stomach acid coming up into the esophagus (gastroesophageal reflux). The esophagus is the tube that carries food from the mouth to the stomach.  Sudden changes in air pressure, such as from descending in an airplane or scuba diving.  Abnormal growths in the nose or throat, such as: ? Growths that line the nose (nasal polyps). ? Abnormal growth of cells (tumors). ? Enlarged tissue at the back of the throat (adenoids). What increases the risk? You are more likely to develop this condition if:  You smoke.  You are overweight.  You are a child who has: ? Certain birth defects of the mouth, such as cleft palate. ? Large tonsils or adenoids. What are the signs or symptoms? Common symptoms of this condition include:  A feeling of fullness in the ear.  Ear pain.  Clicking or popping noises in the ear.  Ringing in the ear.  Hearing loss.  Loss of balance.  Dizziness. Symptoms may get worse when the air pressure around you changes, such as when you travel to an area of high elevation, fly on an airplane, or go scuba diving. How is  this diagnosed? This condition may be diagnosed based on:  Your symptoms.  A physical exam of your ears, nose, and throat.  Tests, such as those that measure: ? The movement of your eardrum (tympanogram). ? Your hearing (audiometry). How is this treated? Treatment depends on the cause and severity of your condition.  In mild cases, you may relieve your symptoms by moving air into your ears. This is called "popping the ears."  In more severe cases, or if you have symptoms of fluid in your ears, treatment may include: ? Medicines to relieve congestion (decongestants). ? Medicines that treat allergies (antihistamines). ? Nasal sprays or ear drops that contain medicines that reduce swelling (steroids). ? A procedure to drain the fluid in your eardrum (myringotomy). In this procedure, a small tube is placed in the eardrum to:  Drain the fluid.  Restore the air in the middle ear space. ? A procedure to insert a balloon device through the nose to inflate the opening of the eustachian tube (balloon dilation). Follow these instructions at home: Lifestyle  Do not do any of the following until your health care provider approves: ? Travel to high altitudes. ? Fly in airplanes. ? Work in a pressurized cabin or room. ? Scuba dive.  Do not use any products that contain nicotine or tobacco, such as cigarettes and e-cigarettes. If you need help quitting, ask your health care provider.  Keep your ears dry. Wear fitted earplugs during showering and bathing. Dry your ears completely after. General instructions  Take over-the-counter   and prescription medicines only as told by your health care provider.  Use techniques to help pop your ears as recommended by your health care provider. These may include: ? Chewing gum. ? Yawning. ? Frequent, forceful swallowing. ? Closing your mouth, holding your nose closed, and gently blowing as if you are trying to blow air out of your nose.  Keep all  follow-up visits as told by your health care provider. This is important. Contact a health care provider if:  Your symptoms do not go away after treatment.  Your symptoms come back after treatment.  You are unable to pop your ears.  You have: ? A fever. ? Pain in your ear. ? Pain in your head or neck. ? Fluid draining from your ear.  Your hearing suddenly changes.  You become very dizzy.  You lose your balance. Summary  Eustachian tube dysfunction refers to a condition in which a blockage develops in the eustachian tube.  It can be caused by ear infections, allergies, inhaled irritants, or abnormal growths in the nose or throat.  Symptoms include ear pain, hearing loss, or ringing in the ears.  Mild cases are treated with maneuvers to unblock the ears, such as yawning or ear popping.  Severe cases are treated with medicines. Surgery may also be done (rare). This information is not intended to replace advice given to you by your health care provider. Make sure you discuss any questions you have with your health care provider. Document Revised: 01/27/2018 Document Reviewed: 01/27/2018 Elsevier Patient Education  2021 [employer] Inc. Otitis Media, Adult  Otitis media is a condition in which the middle ear is red and swollen (inflamed) and full of fluid. The middle ear is the part of the ear that contains bones for hearing as well as air that helps send sounds to the brain. The condition usually goes away on its own. What are the causes? This condition is caused by a blockage in the eustachian tube. The eustachian tube connects the middle ear to the back of the nose. It normally allows air into the middle ear. The blockage is caused by fluid or swelling. Problems that can cause blockage include:  A cold or infection that affects the nose, mouth, or throat.  Allergies.  An irritant, such as tobacco smoke.  Adenoids that have become large. The adenoids are soft tissue located  in the back of the throat, behind the nose and the roof of the mouth.  Growth or swelling in the upper part of the throat, just behind the nose (nasopharynx).  Damage to the ear caused by change in pressure. This is called barotrauma. What are the signs or symptoms? Symptoms of this condition include:  Ear pain.  Fever.  Problems with hearing.  Being tired.  Fluid leaking from the ear.  Ringing in the ear. How is this treated? This condition can go away on its own within 3-5 days. But if the condition is caused by bacteria or does not go away on its own, or if it keeps coming back, your doctor may:  Give you antibiotic medicines.  Give you medicines for pain. Follow these instructions at home:  Take over-the-counter and prescription medicines only as told by your doctor.  If you were prescribed an antibiotic medicine, take it as told by your doctor. Do not stop taking the antibiotic even if you start to feel better.  Keep all follow-up visits as told by your doctor. This is important. Contact a doctor if:  You have bleeding from your nose.  There is a lump on your neck.  You are not feeling better in 5 days.  You feel worse instead of better. Get help right away if:  You have pain that is not helped with medicine.  You have swelling, redness, or pain around your ear.  You get a stiff neck.  You cannot move part of your face (paralysis).  You notice that the bone behind your ear hurts when you touch it.  You get a very bad headache. Summary  Otitis media means that the middle ear is red, swollen, and full of fluid.  This condition usually goes away on its own.  If the problem does not go away, treatment may be needed. You may be given medicines to treat the infection or to treat your pain.  If you were prescribed an antibiotic medicine, take it as told by your doctor. Do not stop taking the antibiotic even if you start to feel better.  Keep all follow-up  visits as told by your doctor. This is important. This information is not intended to replace advice given to you by your health care provider. Make sure you discuss any questions you have with your health care provider. Document Revised: 09/09/2019 Document Reviewed: 09/09/2019 Elsevier Patient Education  2021 [employer].

## 2020-11-07 NOTE — Progress Notes (Signed)
Subjective:    Patient ID: Margaret Hooper, female    DOB: 12/25/1995, 25 y.o.   MRN: [id number]  HPI 25 yo female in non acute distress, presents to clinic with complaints of  Fatigue, HA, not feeling well since Sept 2021, when I saw her last. When RN presented patient to me I recommended rapid Covid-19 test before exam.   She also complains of pain at the base of neck pain, HA daily, "nothing seems to helps the pain".   Seen by urgent care in October 2022 after being seen by me in September in whch she got no relief, treated with a Z-Pak. Here  Also about knot on the side of her left side neck no pain "it has been there since Sept 2021.  Also complains of cloudy urine.  Says she currently on her period. No urinary symptoms  Takes a daily Zyrtec MVI and Flonase intermittently.  Blood pressure 134/84, pulse (!) 103, temperature 98.1 F (36.7 C), temperature source Oral, resp. rate 18, weight 140 lb (63.5 kg), last menstrual period 11/03/2020, SpO2 100 %.     Allergies  Allergen Reactions  . Penicillins Shortness Of Breath  . Elemental Sulfur Rash    Review of Systems  Constitutional: Positive for fatigue. Negative for chills and fever.  HENT: Positive for ear pain and sinus pressure (occasionally). Negative for congestion, ear discharge, postnasal drip, rhinorrhea, sinus pain and sore throat.   Respiratory: Positive for cough (on occasion) and chest tightness (on occasion when trying to fall asleep). Negative for shortness of breath.   Cardiovascular: Negative for chest pain.  Gastrointestinal: Negative for abdominal pain and diarrhea.  Endocrine: Positive for cold intolerance. Negative for heat intolerance, polydipsia, polyphagia and polyuria.  Genitourinary: Negative for difficulty urinating, dysuria and hematuria.  Musculoskeletal: Negative for back pain.  Skin: Negative for rash.  Allergic/Immunologic: Positive for environmental allergies. Negative for food allergies.   Neurological: Positive for light-headedness (with moving head) and headaches. Negative for syncope.  Psychiatric/Behavioral: Negative for sleep disturbance (feels she does nsot sleep well gets about  6 hours "on a good day"). The patient is nervous/anxious (anxieity with not feeling well.).        Objective:   Physical Exam Constitutional:      Appearance: Normal appearance.  HENT:     Head: Normocephalic and atraumatic.     Right Ear: Ear canal and external ear normal.     Left Ear: Ear canal and external ear normal.     Mouth/Throat:     Mouth: Mucous membranes are moist.  Eyes:     Extraocular Movements: Extraocular movements intact.     Conjunctiva/sclera: Conjunctivae normal.     Pupils: Pupils are equal, round, and reactive to light.  Neck:     Comments: Nodule noted on left side of neck Cardiovascular:     Rate and Rhythm: Normal rate and regular rhythm.     Pulses: Normal pulses.     Heart sounds: Normal heart sounds.  Pulmonary:     Effort: Pulmonary effort is normal.     Breath sounds: Normal breath sounds.  Musculoskeletal:        General: Normal range of motion.     Cervical back: Normal range of motion and neck supple.  Skin:    General: Skin is warm.     Capillary Refill: Capillary refill takes less than 2 seconds.  Neurological:     General: No focal deficit present.     Mental Status:  She is alert and oriented to person, place, and time. Mental status is at baseline.     GCS: GCS eye subscore is 4. GCS verbal subscore is 5. GCS motor subscore is 6.     Cranial Nerves: Cranial nerves are intact.     Sensory: Sensation is intact.     Motor: Motor function is intact.     Coordination: Coordination is intact.  Psychiatric:        Attention and Perception: Attention and perception normal.        Mood and Affect: Mood and affect normal.        Speech: Speech normal.        Behavior: Behavior normal. Behavior is cooperative.        Thought Content: Thought  content normal.        Cognition and Memory: Cognition and memory normal.        Judgment: Judgment normal.   gait wnl  UA abnormalities clear looking dip: all wnl.   Covid-19 test Negative  Assessment & Plan:  Otitis media  Left Nodule of left side of  Neck Eustachian tube dysfunction Orders Placed This Encounter  Procedures  . Ambulatory referral to ENT    Referral Priority:   Routine    Referral Type:   Consultation    Referral Reason:   Specialty Services Required    Referred to Provider:   Geanie Logan, MD    Requested Specialty:   Otolaryngology    Number of Visits Requested:   1  . POCT urinalysis dipstick   Meds ordered this encounter  Medications  . azithromycin (ZITHROMAX) 250 MG tablet    Sig: Take  2 tablets by mouth today then one tablet days 2-5. Take with food.    Dispense:  6 tablet    Refill:  0  . predniSONE (STERAPRED UNI-PAK 21 TAB) 10 MG (21) TBPK tablet    Sig: Take 6 tablets by mouth today then 5 tablets tomorrow then one less tablet every day thereafter. Take with food.    Dispense:  21 tablet    Refill:  0  Follow up with [facility] if they do not take your out of state insurance follow up with PCP of your mothers and get local referral. Given information on [facility] P[facility] providers number/website. She verbalizes understanding and has no questions at discharge. Her insuranace is based out of [place].

## 2020-11-16 ENCOUNTER — Other Ambulatory Visit: Payer: Self-pay

## 2020-11-16 ENCOUNTER — Ambulatory Visit: Payer: BC Managed Care – PPO | Admitting: Nurse Practitioner

## 2020-11-16 ENCOUNTER — Encounter: Payer: Self-pay | Admitting: Nurse Practitioner
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
## 2021-11-04 ENCOUNTER — Other Ambulatory Visit: Payer: Self-pay

## 2021-11-04 ENCOUNTER — Encounter: Payer: Self-pay | Admitting: Emergency Medicine

## 2021-11-04 ENCOUNTER — Emergency Department
Admission: EM | Admit: 2021-11-04 | Discharge: 2021-11-04 | Disposition: A | Discharge status: Home / Self Care | Payer: BC Managed Care – PPO | Attending: Emergency Medicine | Admitting: Emergency Medicine

## 2021-11-04 ENCOUNTER — Emergency Department: Payer: BC Managed Care – PPO

## 2021-11-04 DIAGNOSIS — Z20822 Contact with and (suspected) exposure to covid-19: Secondary | ICD-10-CM | POA: Insufficient documentation

## 2021-11-04 DIAGNOSIS — R0602 Shortness of breath: Secondary | ICD-10-CM | POA: Diagnosis present

## 2021-11-04 DIAGNOSIS — R002 Palpitations: Secondary | ICD-10-CM | POA: Insufficient documentation

## 2021-11-04 DIAGNOSIS — R0789 Other chest pain: Secondary | ICD-10-CM | POA: Insufficient documentation

## 2021-11-04 LAB — CBC
HCT: 40.7 % (ref 36.0–46.0)
Hemoglobin: 14.3 g/dL (ref 12.0–15.0)
MCH: 33.3 pg (ref 26.0–34.0)
MCHC: 35.1 g/dL (ref 30.0–36.0)
MCV: 94.7 fL (ref 80.0–100.0)
Platelets: 205 10*3/uL (ref 150–400)
RBC: 4.3 MIL/uL (ref 3.87–5.11)
RDW: 11.8 % (ref 11.5–15.5)
WBC: 9.2 10*3/uL (ref 4.0–10.5)
nRBC: 0 % (ref 0.0–0.2)

## 2021-11-04 LAB — BASIC METABOLIC PANEL
Anion gap: 9 (ref 5–15)
BUN: 14 mg/dL (ref 6–20)
CO2: 23 mmol/L (ref 22–32)
Calcium: 9.6 mg/dL (ref 8.9–10.3)
Chloride: 106 mmol/L (ref 98–111)
Creatinine, Ser: 0.8 mg/dL (ref 0.44–1.00)
GFR, Estimated: >60 mL/min (ref >60)
Glucose, Bld: 100 mg/dL — ABNORMAL HIGH (ref 70–99)
Potassium: 3.6 mmol/L (ref 3.5–5.1)
Sodium: 138 mmol/L (ref 135–145)

## 2021-11-04 LAB — RESP PANEL BY RT-PCR (FLU A&B, COVID) ARPGX2
Influenza A by PCR: NEGATIVE
Influenza B by PCR: NEGATIVE
SARS Coronavirus 2 by RT PCR: NEGATIVE

## 2021-11-04 LAB — D-DIMER, QUANTITATIVE: D-Dimer, Quant: 0.27 ug/mL-FEU (ref 0.00–0.50)

## 2021-11-04 LAB — TSH: TSH: 2.106 u[IU]/mL (ref 0.350–4.500)

## 2021-11-04 LAB — TROPONIN I (HIGH SENSITIVITY): Troponin I (High Sensitivity): <2 ng/L (ref <18)

## 2021-11-04 LAB — HCG, QUANTITATIVE, PREGNANCY: hCG, Beta Chain, Quant, S: <1 m[IU]/mL (ref <5)

## 2021-11-04 MED ORDER — ALBUTEROL SULFATE HFA 108 (90 BASE) MCG/ACT IN AERS: 2.0000 | INHALATION_SPRAY | RESPIRATORY_TRACT | 0 refills | Status: AC | PRN

## 2021-11-04 NOTE — Discharge Instructions (Signed)
Finish the Medrol Dosepak as prescribed.  Use the albuterol up to every 4 hours as needed for shortness of breath.  Follow-up with a cardiologist; we have provided referrals to both of our area cardiology practices.  Return to the ER for new, worsening, or persistent severe shortness of breath, chest pain, palpitations, weakness or lightheadedness, feeling like you are going to pass out, or any other new or worsening symptoms that concern you.

## 2021-11-04 NOTE — ED Triage Notes (Signed)
Pt to ED via POV with c/o SOB, pt states seen for same several months ago that started with associated "breast issue". Pt states SOB and "winded feeling" has continued, states was seen at walk-in several days ago and dx with costocondritis and given a medrol dose pack.

## 2021-11-04 NOTE — ED Provider Notes (Signed)
[facility] Provider Note    Event Date/Time   First MD Initiated Contact with Patient 11/04/21 [phone]     (approximate)   History   Shortness of Breath and Chest Pain   HPI  Margaret Hooper is a 26 y.o. female with no active medical problems who presents with multiple subacute complaints.  Specifically, the patient reports intermittent shortness of breath over the last 1.5 months, sometimes occurring on its own, sometimes with minimal exertion, although she is able to exert herself without having the shortness of breath.  She describes it as a feeling of being winded, or if not being able to take a full, deep breath.  She denies any associated cough, fever, or leg pain or swelling.  In addition, she has had intermittent palpitations over approximately the same time, and sometimes has noted that her heart rate will be around 100 or even slightly higher.  This is usually not during any exertion.  She sometimes feels lightheaded although it is not necessarily at the same time as the elevated heart rate.  The patient also has some intermittent right-sided sharp chest pain which has been previously diagnosed as costochondritis.  The patient was seen last month for left breast lumps and scheduled for an outpatient ultrasound and gynecology follow-up.  At that time she was diagnosed with costochondritis.  She went to an urgent care several days ago and was again diagnosed with costochondritis and given a Medrol Dosepak which she states is not really helping.  The patient states that she also has used an albuterol inhaler with minimal improvement.   Physical Exam   Triage Vital Signs: ED Triage Vitals  Enc Vitals Group     BP 11/04/21 0558 (!) 137/92     Pulse Rate 11/04/21 0558 94     Resp 11/04/21 0558 16     Temp 11/04/21 0558 98.2 F (36.8 C)     Temp src --      SpO2 11/04/21 0558 100 %     Weight 11/04/21 0554 135 lb (61.2 kg)     Height 11/04/21 0554 5' 8.5"  (1.74 m)     Head Circumference --      Peak Flow --      Pain Score 11/04/21 0554 3     Pain Loc --      Pain Edu? --      Excl. in GC? --     Most recent vital signs: Vitals:   11/04/21 0930 11/04/21 1000  BP: 117/77 119/74  Pulse: 65 74  Resp: 20 16  Temp:    SpO2: 100% 100%    General: Awake, no distress.  CV:  Good peripheral perfusion.  Normal heart sounds. Resp:  Normal effort.  Lungs CTAB. Abd:  No distention.  Other:  No calf or popliteal swelling or tenderness.   ED Results / Procedures / Treatments   Labs (all labs ordered are listed, but only abnormal results are displayed) Labs Reviewed  BASIC METABOLIC PANEL - Abnormal; Notable for the following components:      Result Value   Glucose, Bld 100 (*)    All other components within normal limits  RESP PANEL BY RT-PCR (FLU A&B, COVID) ARPGX2  CBC  D-DIMER, QUANTITATIVE  HCG, QUANTITATIVE, PREGNANCY  TSH  TROPONIN I (HIGH SENSITIVITY)     EKG  ED ECG REPORT I, Dionne Bucy, the attending physician, personally viewed and interpreted this ECG.  Date: 11/04/2021 EKG Time: 0554 Rate:  78 Rhythm: normal sinus rhythm with sinus arrhythmia QRS Axis: normal Intervals: normal ST/T Wave abnormalities: normal Narrative Interpretation: no evidence of acute ischemia   RADIOLOGY  Chest x-ray: I interpreted the images.  There is no evidence of focal consolidation or edema.  I reviewed the radiology report which confirms no acute abnormality.  PROCEDURES:  Critical Care performed: No  Procedures   MEDICATIONS ORDERED IN ED: Medications - No data to display   IMPRESSION / MDM / ASSESSMENT AND PLAN / ED COURSE  I reviewed the triage vital signs and the nursing notes.  26 year old female with no active medical problems presents with 1 to 2 months of intermittent shortness of breath, palpitations, and atypical chest pain which has been previously diagnosed as costochondritis.  The symptoms do not  necessarily occur simultaneously.  On exam the patient is well-appearing and her vital signs are normal.  The physical exam is otherwise unremarkable.  The patient is on the cardiac monitor to evaluate for evidence of arrhythmia and/or significant heart rate changes.  I reviewed the past medical records; the patient was seen at [facility] in [place] on 12/22 for left breast lumps as well as chest pain and diagnosed with costochondritis.  The patient self-reports another visit earlier this week at an outpatient clinic for which I do not have records.  She was again diagnosed with costochondritis at that time.  She has follow-up arranged for evaluation of the breast.  She has been unable to arrange cardiology follow-up since she is splitting her time between [place] and here.  Chest pain: This is consistent with costochondritis or other musculoskeletal pain.  Chest x-ray is normal.  I reviewed the lab work-up.  Troponin is negative.  The patient has no ACS risk factors and the pain is not consistent with ACS.  Given that it is intermittent it is not consistent with aortic dissection or other vascular cause.  I do not suspect PE.  The patient is PERC negative, but a D-dimer was obtained from triage and is also negative.    Palpitations: EKG is normal.  It is possible that the patient could be having some transient arrhythmia although she does not have any sustained symptoms and is asymptomatic currently.  She reports occasional lightheadedness but has no syncope or near syncope.  Differential primarily includes anxiety, dehydration, or other etiology such as hypothyroidism.  We will add on a TSH here, and plan for cardiology referral  Shortness of breath: As above, this could be related to anxiety, hypothyroidism, bronchitis/reactive airways (although the patient reports minimal improvement with albuterol).  Chest x-ray is clear and there is no evidence of pneumonia.  There is no evidence of PE.  We  will obtain a TSH.  If this is negative I anticipate discharge home with cardiology referral.  ----------------------------------------- 10:19 AM on 11/04/2021 -----------------------------------------  TSH is normal.  The patient is stable for discharge home.  I counseled her on the results of the work-up and on appropriate follow-up.  I have prescribed albuterol.  Return precautions given, and she expresses understanding.   FINAL CLINICAL IMPRESSION(S) / ED DIAGNOSES   Final diagnoses:  Shortness of breath  Atypical chest pain  Palpitations     Rx / DC Orders   ED Discharge Orders          Ordered    albuterol (VENTOLIN HFA) 108 (90 Base) MCG/ACT inhaler  Every 4 hours PRN        11/04/21 1018  Note:  This document was prepared using Dragon voice recognition software and may include unintentional dictation errors.    Dionne BucySiadecki, Kyndal Gloster, MD 11/04/21 1019

## 2021-11-04 NOTE — ED Notes (Signed)
Pt c/o sternal soreness w/ palpitation and reports difficulty taking full deep breaths.  Pt able to speak full sentences.

## 2021-11-04 NOTE — ED Notes (Signed)
Lab called to add on TSH 

## 2021-11-16 ENCOUNTER — Ambulatory Visit (INDEPENDENT_AMBULATORY_CARE_PROVIDER_SITE_OTHER): Payer: BC Managed Care – PPO | Admitting: Cardiology

## 2021-11-16 ENCOUNTER — Encounter: Payer: Self-pay | Admitting: Cardiology

## 2021-11-16 ENCOUNTER — Ambulatory Visit (INDEPENDENT_AMBULATORY_CARE_PROVIDER_SITE_OTHER): Payer: BC Managed Care – PPO

## 2021-11-16 ENCOUNTER — Other Ambulatory Visit: Payer: Self-pay

## 2021-11-16 VITALS — BP 130/82 | HR 73 | Ht 68.5 in | Wt 132.0 lb

## 2021-11-16 DIAGNOSIS — R0602 Shortness of breath: Secondary | ICD-10-CM

## 2021-11-16 DIAGNOSIS — R002 Palpitations: Secondary | ICD-10-CM | POA: Diagnosis not present

## 2021-11-16 NOTE — Progress Notes (Signed)
Cardiology Office Note:    Date:  11/16/2021   ID:  Margaret Hooper, DOB 06-Apr-1996, MRN [id number]  PCP:  Pcp, No   [facility] Providers Cardiologist:  None     Referring MD: No ref. provider found   Chief Complaint  Patient presents with   New Patient (Initial Visit)    ED follow up -- Labored breathing - even when sitting, when lying on back it feels like something is sitting on her. Meds reviewed verbally with patient.      History of Present Illness:    Margaret Hooper is a 26 y.o. female with a hx of exercise-induced asthma, who presents with shortness of breath and palpitations.  Patient has noticed increased labored breathing when she exercises over the past 2 months.  She still exercises by playing tennis but states getting out of breath much easier.  Has also noticed occasional palpitations occurring randomly, sometimes 3 times a week associated with feeling of lightheadedness.  Denies any history of heart disease.  Has occasional right-sided chest pain, reproducible with palpation.  Diagnosed with costochondritis in the past.  Also diagnosed with a lump in her breast, has appointments for additional testing.  Moved to the area from [place], has not established with PCP locally.  History reviewed. No pertinent past medical history.  Past Surgical History:  Procedure Laterality Date   TONSILLECTOMY      Current Medications: Current Meds  Medication Sig   albuterol (VENTOLIN HFA) 108 (90 Base) MCG/ACT inhaler Inhale 2 puffs into the lungs every 4 (four) hours as needed for wheezing or shortness of breath.   Multiple Vitamin (MULTIVITAMIN) tablet Take 1 tablet by mouth daily.     Allergies:   Penicillins and Elemental sulfur   Social History   Socioeconomic History   Marital status: Single    Spouse name: Not on file   Number of children: Not on file   Years of education: Not on file   Highest education level: Not on file  Occupational History   Not on file   Tobacco Use   Smoking status: Never   Smokeless tobacco: Never  Substance and Sexual Activity   Alcohol use: Yes    Comment: occ   Drug use: Never   Sexual activity: Not on file  Other Topics Concern   Not on file  Social History Narrative   Not on file   Social Determinants of Health   Financial Resource Strain: Not on file  Food Insecurity: Not on file  Transportation Needs: Not on file  Physical Activity: Not on file  Stress: Not on file  Social Connections: Not on file     Family History: The patient's family history includes Cancer in her mother and paternal grandmother; Heart disease in her maternal grandfather and maternal uncle; Hypertension in her maternal grandfather and maternal grandmother.  ROS:   Please see the history of present illness.     All other systems reviewed and are negative.  EKGs/Labs/Other Studies Reviewed:    The following studies were reviewed today:   EKG:  EKG is  ordered today.  The ekg ordered today demonstrates normal sinus rhythm normal ECG.  Recent Labs: 11/04/2021: BUN 14; Creatinine, Ser 0.80; Hemoglobin 14.3; Platelets 205; Potassium 3.6; Sodium 138; TSH 2.106  Recent Lipid Panel No results found for: CHOL, TRIG, HDL, CHOLHDL, VLDL, LDLCALC, LDLDIRECT   Risk Assessment/Calculations:         Physical Exam:    VS:  BP 130/82 (BP  Location: Left Arm, Patient Position: Sitting, Cuff Size: Normal)    Pulse 73    Ht 5' 8.5" (1.74 m)    Wt 132 lb (59.9 kg)    SpO2 98%    BMI 19.78 kg/m     Wt Readings from Last 3 Encounters:  11/16/21 132 lb (59.9 kg)  11/04/21 135 lb (61.2 kg)  11/16/20 134 lb (60.8 kg)     GEN:  Well nourished, well developed in no acute distress HEENT: Normal NECK: No JVD; No carotid bruits LYMPHATICS: No lymphadenopathy CARDIAC: RRR, no murmurs, rubs, gallops RESPIRATORY:  Clear to auscultation without rales, wheezing or rhonchi  ABDOMEN: Soft, non-tender, non-distended MUSCULOSKELETAL:  No edema;  No deformity  SKIN: Warm and dry NEUROLOGIC:  Alert and oriented x 3 PSYCHIATRIC:  Normal affect   ASSESSMENT:    1. Shortness of breath   2. Palpitations    PLAN:    In order of problems listed above:  Dyspnea on exertion, does not appear to be anginal equivalent.  Patient is low cardiac risk with no cardiac risk factors.  Get echo to evaluate any structural abnormalities.  If normal, consider pulm input due to history of exercise-induced asthma. Palpitations, place cardiac monitor x2 weeks to evaluate any significant arrhythmias.  Follow-up after cardiac testing.      Medication Adjustments/Labs and Tests Ordered: Current medicines are reviewed at length with the patient today.  Concerns regarding medicines are outlined above.  Orders Placed This Encounter  Procedures   LONG TERM MONITOR (3-14 DAYS)   EKG 12-Lead   ECHOCARDIOGRAM COMPLETE   No orders of the defined types were placed in this encounter.   Patient Instructions  Medication Instructions:  Your physician recommends that you continue on your current medications as directed. Please refer to the Current Medication list given to you today.  *If you need a refill on your cardiac medications before your next appointment, please call your pharmacy*   Lab Work: None ordered If you have labs (blood work) drawn today and your tests are completely normal, you will receive your results only by: MyChart Message (if you have MyChart) OR A paper copy in the mail If you have any lab test that is abnormal or we need to change your treatment, we will call you to review the results.   Testing/Procedures:   Your physician has requested that you have an echocardiogram. Echocardiography is a painless test that uses sound waves to create images of your heart. It provides your doctor with information about the size and shape of your heart and how well your hearts chambers and valves are working. This procedure takes  approximately one hour. There are no restrictions for this procedure.  2.   Your physician has recommended that you wear a Zio XT monitor for 2 weeks, this will be mailed to your home in 4-5 business days.  This monitor is a medical device that records the hearts electrical activity. Doctors most often use these monitors to diagnose arrhythmias. Arrhythmias are problems with the speed or rhythm of the heartbeat. The monitor is a small device applied to your chest. You can wear one while you do your normal daily activities. While wearing this monitor if you have any symptoms to push the button and record what you felt. Once you have worn this monitor for the period of time provider prescribed (Usually 14 days), you will return the monitor device in the postage paid box. Once it is returned they will download  the data collected and provide us with a report which the provider will then review and we will call you with those results. Important tips:  Avoid showering during the first 24 hours of wearing the monitor. Avoid excessive sweating to help maximize wear time. Do not submerge the device, no hot tubs, and no swimming pools. Keep any lotions or oils away from the patch. After 24 hours you may shower with the patch on. Take brief showers with your back facing the shower head.  Do not remove patch once it has been placed because that will interrupt data and decrease adhesive wear time. Push the button when you have any symptoms and write down what you were feeling. Once you have completed wearing your monitor, remove and place into box which has postage paid and place in your outgoing mailbox.  If for some reason you have misplaced your box then call our office and we can provide another box and/or mail it off for you.      Follow-Up: At [facility], you and your health needs are our priority.  As part of our continuing mission to provide you with exceptional heart care, we have created  designated Provider Care Teams.  These Care Teams include your primary Cardiologist (physician) and Advanced Practice Providers (APPs -  Physician Assistants and Nurse Practitioners) who all work together to provide you with the care you need, when you need it.  We recommend signing up for the patient portal called "MyChart".  Sign up information is provided on this After Visit Summary.  MyChart is used to connect with patients for Virtual Visits (Telemedicine).  Patients are able to view lab/test results, encounter notes, upcoming appointments, etc.  Non-urgent messages can be sent to your provider as well.   To learn more about what you can do with MyChart, go to [email or web address].    Your next appointment:   6 week(s)  The format for your next appointment:   In Person  Provider:   You may see Debbe OdeaBrian Agbor-Etang, MD or one of the following Advanced Practice Providers on your designated Care Team:   Nicolasa Duckinghristopher Berge, NP Eula Listenyan Dunn, PA-C Cadence Fransico MichaelFurth, [place]-C    Other Instructions   Mail Zio to:  [address] [place] [id number]    Signed, Debbe OdeaBrian Agbor-Etang, MD  11/16/2021 2:49 PM    [facility]

## 2021-11-16 NOTE — Patient Instructions (Addendum)
Medication Instructions:  Your physician recommends that you continue on your current medications as directed. Please refer to the Current Medication list given to you today.  *If you need a refill on your cardiac medications before your next appointment, please call your pharmacy*   Lab Work: None ordered If you have labs (blood work) drawn today and your tests are completely normal, you will receive your results only by: MyChart Message (if you have MyChart) OR A paper copy in the mail If you have any lab test that is abnormal or we need to change your treatment, we will call you to review the results.   Testing/Procedures:   Your physician has requested that you have an echocardiogram. Echocardiography is a painless test that uses sound waves to create images of your heart. It provides your doctor with information about the size and shape of your heart and how well your hearts chambers and valves are working. This procedure takes approximately one hour. There are no restrictions for this procedure.  2.   Your physician has recommended that you wear a Zio XT monitor for 2 weeks, this will be mailed to your home in 4-5 business days.  This monitor is a medical device that records the hearts electrical activity. Doctors most often use these monitors to diagnose arrhythmias. Arrhythmias are problems with the speed or rhythm of the heartbeat. The monitor is a small device applied to your chest. You can wear one while you do your normal daily activities. While wearing this monitor if you have any symptoms to push the button and record what you felt. Once you have worn this monitor for the period of time provider prescribed (Usually 14 days), you will return the monitor device in the postage paid box. Once it is returned they will download the data collected and provide [place] with a report which the provider will then review and we will call you with those results. Important tips:  Avoid showering  during the first 24 hours of wearing the monitor. Avoid excessive sweating to help maximize wear time. Do not submerge the device, no hot tubs, and no swimming pools. Keep any lotions or oils away from the patch. After 24 hours you may shower with the patch on. Take brief showers with your back facing the shower head.  Do not remove patch once it has been placed because that will interrupt data and decrease adhesive wear time. Push the button when you have any symptoms and write down what you were feeling. Once you have completed wearing your monitor, remove and place into box which has postage paid and place in your outgoing mailbox.  If for some reason you have misplaced your box then call our office and we can provide another box and/or mail it off for you.      Follow-Up: At [facility], you and your health needs are our priority.  As part of our continuing mission to provide you with exceptional heart care, we have created designated Provider Care Teams.  These Care Teams include your primary Cardiologist (physician) and Advanced Practice Providers (APPs -  Physician Assistants and Nurse Practitioners) who all work together to provide you with the care you need, when you need it.  We recommend signing up for the patient portal called "MyChart".  Sign up information is provided on this After Visit Summary.  MyChart is used to connect with patients for Virtual Visits (Telemedicine).  Patients are able to view lab/test results, encounter notes, upcoming appointments,  etc.  Non-urgent messages can be sent to your provider as well.   To learn more about what you can do with MyChart, go to [email or web address].    Your next appointment:   6 week(s)  The format for your next appointment:   In Person  Provider:   You may see Debbe Odea, MD or one of the following Advanced Practice Providers on your designated Care Team:   Nicolasa Ducking, NP Eula Listen, PA-C Cadence Fransico Michael,  [place]    Other Instructions   Mail Zio to:  [address] [place] [address]

## 2021-11-27 ENCOUNTER — Telehealth: Payer: Self-pay | Admitting: Cardiology

## 2021-11-27 NOTE — Telephone Encounter (Signed)
Patient received heart monitor & is asking how long is necessary to wear monitor she has echo appointment in 10 days, please assist

## 2021-11-29 NOTE — Telephone Encounter (Signed)
Called and spoke with patient, she informed me that she was out of town when the monitor arrived and if she were to put the monitor on now she would only have 7 days of wear time as she has an Echo scheduled for 12/07/21.  She asked if she could apply it after the Echo so she could have the full 14 days of wear time. I advised her that that would be fine. Patient was grateful for the follow up.

## 2021-12-07 ENCOUNTER — Other Ambulatory Visit: Payer: BC Managed Care – PPO

## 2021-12-21 ENCOUNTER — Other Ambulatory Visit: Payer: Self-pay

## 2021-12-21 ENCOUNTER — Encounter: Payer: Self-pay | Admitting: Family Medicine

## 2021-12-21 ENCOUNTER — Ambulatory Visit (INDEPENDENT_AMBULATORY_CARE_PROVIDER_SITE_OTHER): Payer: BC Managed Care – PPO | Admitting: Family Medicine

## 2021-12-21 VITALS — BP 122/84 | HR 65 | Ht 68.5 in | Wt 136.0 lb

## 2021-12-21 DIAGNOSIS — F5104 Psychophysiologic insomnia: Secondary | ICD-10-CM

## 2021-12-21 DIAGNOSIS — R0609 Other forms of dyspnea: Secondary | ICD-10-CM | POA: Diagnosis not present

## 2021-12-21 NOTE — Patient Instructions (Signed)
-   Maintain follow-up with cardiology and their associated work-up (echocardiogram, monitor, etc.) ?- Maintain follow-up with allergist, please send their test results, etc. once available ?- Can check with your insurance regarding annual physical labs and associated coverage ?- Referral coordinator will contact you in regards to scheduling follow-up with psychiatrist ?- Review information attached ?- Return for annual physical in 8 weeks ?- Contact us for any questions between now and then ?

## 2021-12-25 NOTE — Assessment & Plan Note (Addendum)
On and off for years in the setting of previously diagnosed asthma.  Has been progressively exacerbated since the end of last year resulting in ER visit on 11/04/2021.  She has noted exertional component but is also noted symptoms arise with relative inactivity.  She has since seen cardiology who has ordered echocardiogram and Holter monitor. ? ?From a differential standpoint I reviewed the various possible causes of her stated chest pain, inclusive of Cardiologic, gastrointestinal, pulmonary, psychological, and musculoskeletal.  From a physical examination standpoint, she has positive S1-S2, regular rate and rhythm, no additional heart sounds, clear lung fields throughout without wheezes, rales, rhonchi, nontender throughout the sternal region and parasternal regions, no JVD, no peripheral edema, symmetric pulses noted. ? ?I have advised initiation of PPI to exclude GI related causes, advised follow-up with cardiology and their associated work-up, referrals placed to psychiatry and allergist.  I have also reviewed nonpharmacologic methods for stress management over the interim.  Plan for close follow-up to assess symptom response and obtain updates from various referrals. ?

## 2021-12-25 NOTE — Progress Notes (Signed)
?  ? ?Primary Care / Sports Medicine Office Visit ? ?Patient Information:  ?Patient ID: Margaret Hooper, female DOB: 24-Jun-1996 Age: 26 y.o. MRN: [id number]  ? ?Margaret Hooper is a pleasant 26 y.o. female presenting with the following: ? ?Chief Complaint  ?Patient presents with  ? New Patient (Initial Visit)  ? Establish Care  ? Shortness of Breath  ?  Seen by ER 11/04/2021 and given an inhaler for flares. Cardiac work-up with cardiology 11/16/2021 who suggested for patient to get an ECHO.  Patient has not followed up due to financial constraints.  Feels like she has been "huffing and puffing" since 08/2021, but does not know why.  Reports stressful job.  ? ? ?Vitals:  ? 12/21/21 0925  ?BP: 122/84  ?Pulse: 65  ?SpO2: 99%  ? ?Vitals:  ? 12/21/21 0925  ?Weight: 136 lb (61.7 kg)  ?Height: 5' 8.5" (1.74 m)  ? ?Body mass index is 20.38 kg/m?. ? ?No results found.  ? ?Independent interpretation of notes and tests performed by another provider:  ? ?None ? ?Procedures performed:  ? ?None ? ?Pertinent History, Exam, Impression, and Recommendations:  ? ?Exertional dyspnea ?On and off for years in the setting of previously diagnosed asthma.  Has been progressively exacerbated since the end of last year resulting in ER visit on 11/04/2021.  She has noted exertional component but is also noted symptoms arise with relative inactivity.  She has since seen cardiology who has ordered echocardiogram and Holter monitor. ? ?From a differential standpoint I reviewed the various possible causes of her stated chest pain, inclusive of Cardiologic, gastrointestinal, pulmonary, psychological, and musculoskeletal.  From a physical examination standpoint, she has positive S1-S2, regular rate and rhythm, no additional heart sounds, clear lung fields throughout without wheezes, rales, rhonchi, nontender throughout the sternal region and parasternal regions, no JVD, no peripheral edema, symmetric pulses noted. ? ?I have advised initiation of PPI to exclude  GI related causes, advised follow-up with cardiology and their associated work-up, referrals placed to psychiatry and allergist.  I have also reviewed nonpharmacologic methods for stress management over the interim.  Plan for close follow-up to assess symptom response and obtain updates from various referrals. ? ?Insomnia, psychophysiological ?Over the same 2 month timeframe of chest pain, she does endorse increased anxiety, associated sleep disruption/insomnia.  Pharmacologic and nonpharmacologic measures were reviewed, and at this stage she we will try sleep hygiene methods of psychiatry, and follow-up as scheduled.  We can review pharmacologic methods pending at her return.  ? ?I provided a total time of 72 minutes including both face-to-face and non-face-to-face time on 12/25/2021 inclusive of time utilized for medical chart review, information gathering, care coordination with staff, and documentation completion.  Specifically, reviewed the multiple etiologies possible leading to chest pain and her stated symptomatology given her specific past medical history, stated complaints, work-up thus far.  We reviewed the role of the GI system, pulmonary, and cardiovascular system, additionally musculoskeletal and psychiatric etiologies discussed.  We reviewed various methods to address these from an evaluation and management standpoint given her stated desire to refrain from Rx management.  Additionally, need for appropriate follow-up and possible next steps pending symptoms at her return were addressed.  Questions were answered at length. ? ? ?Orders & Medications ?No orders of the defined types were placed in this encounter. ? ?No orders of the defined types were placed in this encounter. ?  ? ?Return in about 8 weeks (around 02/15/2022).  ?  ? ?Ocie Bob  Ashley Royalty, MD ? ? Primary Care Sports Medicine ?[facility] ?[facility]  ? ?

## 2021-12-25 NOTE — Assessment & Plan Note (Addendum)
Over the same 2 month timeframe of chest pain, she does endorse increased anxiety, associated sleep disruption/insomnia.  Pharmacologic and nonpharmacologic measures were reviewed, and at this stage she we will try sleep hygiene methods of psychiatry, and follow-up as scheduled.  We can review pharmacologic methods pending at her return. ?

## 2021-12-31 ENCOUNTER — Ambulatory Visit: Payer: BC Managed Care – PPO | Admitting: Cardiology

## 2022-01-14 ENCOUNTER — Other Ambulatory Visit: Payer: BC Managed Care – PPO

## 2022-01-25 ENCOUNTER — Ambulatory Visit: Payer: BC Managed Care – PPO | Admitting: Cardiology

## 2022-02-15 ENCOUNTER — Ambulatory Visit: Payer: BC Managed Care – PPO | Admitting: Family Medicine
